# Patient Record
Sex: Female | Born: 1985 | Race: Black or African American | Hispanic: No | Marital: Single | Smoking: Never smoker
Health system: Southern US, Community
[De-identification: ages and names within clinical notes are randomized; demographics above are authoritative.]

## PROBLEM LIST (undated history)

## (undated) DIAGNOSIS — S92919A Unspecified fracture of unspecified toe(s), initial encounter for closed fracture: Secondary | ICD-10-CM

## (undated) DIAGNOSIS — N83209 Unspecified ovarian cyst, unspecified side: Secondary | ICD-10-CM

## (undated) DIAGNOSIS — A499 Bacterial infection, unspecified: Secondary | ICD-10-CM

## (undated) DIAGNOSIS — B019 Varicella without complication: Secondary | ICD-10-CM

## (undated) DIAGNOSIS — B379 Candidiasis, unspecified: Secondary | ICD-10-CM

## (undated) DIAGNOSIS — B999 Unspecified infectious disease: Secondary | ICD-10-CM

## (undated) DIAGNOSIS — IMO0002 Reserved for concepts with insufficient information to code with codable children: Secondary | ICD-10-CM

## (undated) HISTORY — DX: Bacterial infection, unspecified: A49.9

## (undated) HISTORY — DX: Candidiasis, unspecified: B37.9

## (undated) HISTORY — DX: Varicella without complication: B01.9

## (undated) HISTORY — DX: Unspecified infectious disease: B99.9

## (undated) HISTORY — PX: INDUCED ABORTION: SHX677

## (undated) HISTORY — DX: Reserved for concepts with insufficient information to code with codable children: IMO0002

## (undated) HISTORY — PX: DILATION AND CURETTAGE OF UTERUS: SHX78

## (undated) HISTORY — DX: Unspecified fracture of unspecified toe(s), initial encounter for closed fracture: S92.919A

## (undated) HISTORY — DX: Unspecified ovarian cyst, unspecified side: N83.209

---
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
## 2004-01-27 ENCOUNTER — Emergency Department (HOSPITAL_COMMUNITY): Admission: EM | Admit: 2004-01-27 | Discharge: 2004-01-27 | Payer: Self-pay | Admitting: Emergency Medicine

## 2004-02-22 ENCOUNTER — Emergency Department (HOSPITAL_COMMUNITY): Admission: EM | Admit: 2004-02-22 | Discharge: 2004-02-23 | Payer: Self-pay | Admitting: Emergency Medicine

## 2004-10-05 ENCOUNTER — Ambulatory Visit: Payer: Self-pay | Admitting: Internal Medicine

## 2005-04-11 ENCOUNTER — Ambulatory Visit: Payer: Self-pay | Admitting: Family Medicine

## 2005-07-11 ENCOUNTER — Ambulatory Visit: Payer: Self-pay | Admitting: Family Medicine

## 2006-11-25 DIAGNOSIS — R87619 Unspecified abnormal cytological findings in specimens from cervix uteri: Secondary | ICD-10-CM

## 2006-11-25 DIAGNOSIS — IMO0002 Reserved for concepts with insufficient information to code with codable children: Secondary | ICD-10-CM

## 2006-11-25 HISTORY — DX: Unspecified abnormal cytological findings in specimens from cervix uteri: R87.619

## 2006-11-25 HISTORY — DX: Reserved for concepts with insufficient information to code with codable children: IMO0002

## 2009-06-20 ENCOUNTER — Inpatient Hospital Stay (HOSPITAL_COMMUNITY): Admission: AD | Admit: 2009-06-20 | Discharge: 2009-06-20 | Payer: Self-pay | Admitting: Obstetrics & Gynecology

## 2009-08-01 ENCOUNTER — Emergency Department (HOSPITAL_COMMUNITY): Admission: EM | Admit: 2009-08-01 | Discharge: 2009-08-01 | Payer: Self-pay | Admitting: Emergency Medicine

## 2010-08-09 ENCOUNTER — Emergency Department (HOSPITAL_COMMUNITY): Admission: EM | Admit: 2010-08-09 | Discharge: 2010-08-09 | Payer: Self-pay | Admitting: Family Medicine

## 2010-11-25 DIAGNOSIS — S92919A Unspecified fracture of unspecified toe(s), initial encounter for closed fracture: Secondary | ICD-10-CM

## 2010-11-25 HISTORY — DX: Unspecified fracture of unspecified toe(s), initial encounter for closed fracture: S92.919A

## 2011-02-05 DIAGNOSIS — B379 Candidiasis, unspecified: Secondary | ICD-10-CM

## 2011-02-05 HISTORY — DX: Candidiasis, unspecified: B37.9

## 2011-02-23 DIAGNOSIS — A499 Bacterial infection, unspecified: Secondary | ICD-10-CM

## 2011-02-23 DIAGNOSIS — B019 Varicella without complication: Secondary | ICD-10-CM

## 2011-02-23 HISTORY — DX: Varicella without complication: B01.9

## 2011-02-23 HISTORY — DX: Bacterial infection, unspecified: A49.9

## 2011-02-23 HISTORY — PX: WISDOM TOOTH EXTRACTION: SHX21

## 2011-03-03 LAB — URINALYSIS, ROUTINE W REFLEX MICROSCOPIC
Glucose, UA: NEGATIVE mg/dL
Hgb urine dipstick: NEGATIVE
Specific Gravity, Urine: 1.01 (ref 1.005–1.030)
Urobilinogen, UA: 0.2 mg/dL (ref 0.0–1.0)
pH: 5.5 (ref 5.0–8.0)

## 2011-03-03 LAB — WET PREP, GENITAL
Clue Cells Wet Prep HPF POC: NONE SEEN
Trich, Wet Prep: NONE SEEN
Yeast Wet Prep HPF POC: NONE SEEN

## 2011-03-03 LAB — GC/CHLAMYDIA PROBE AMP, GENITAL: GC Probe Amp, Genital: NEGATIVE

## 2012-03-04 ENCOUNTER — Ambulatory Visit: Payer: Self-pay | Admitting: Obstetrics and Gynecology

## 2012-03-25 ENCOUNTER — Other Ambulatory Visit (INDEPENDENT_AMBULATORY_CARE_PROVIDER_SITE_OTHER): Payer: 59

## 2012-03-25 DIAGNOSIS — N912 Amenorrhea, unspecified: Secondary | ICD-10-CM

## 2012-03-25 NOTE — Progress Notes (Unsigned)
LMP 02-24-2012, PNV samples given.

## 2012-04-07 ENCOUNTER — Ambulatory Visit: Payer: Self-pay | Admitting: Obstetrics and Gynecology

## 2012-04-08 ENCOUNTER — Ambulatory Visit: Payer: Self-pay | Admitting: Obstetrics and Gynecology

## 2012-04-13 ENCOUNTER — Encounter (HOSPITAL_COMMUNITY): Payer: Self-pay | Admitting: *Deleted

## 2012-04-13 ENCOUNTER — Inpatient Hospital Stay (HOSPITAL_COMMUNITY)
Admission: AD | Admit: 2012-04-13 | Discharge: 2012-04-13 | Disposition: A | Discharge status: Home / Self Care | Payer: 59 | Source: Ambulatory Visit | Attending: Obstetrics and Gynecology | Admitting: Obstetrics and Gynecology

## 2012-04-13 ENCOUNTER — Inpatient Hospital Stay (HOSPITAL_COMMUNITY): Payer: 59

## 2012-04-13 ENCOUNTER — Other Ambulatory Visit: Payer: Self-pay | Admitting: Obstetrics and Gynecology

## 2012-04-13 ENCOUNTER — Telehealth: Payer: Self-pay | Admitting: Obstetrics and Gynecology

## 2012-04-13 DIAGNOSIS — O210 Mild hyperemesis gravidarum: Secondary | ICD-10-CM | POA: Insufficient documentation

## 2012-04-13 DIAGNOSIS — R109 Unspecified abdominal pain: Secondary | ICD-10-CM | POA: Insufficient documentation

## 2012-04-13 DIAGNOSIS — O219 Vomiting of pregnancy, unspecified: Secondary | ICD-10-CM

## 2012-04-13 DIAGNOSIS — M549 Dorsalgia, unspecified: Secondary | ICD-10-CM | POA: Insufficient documentation

## 2012-04-13 DIAGNOSIS — O218 Other vomiting complicating pregnancy: Secondary | ICD-10-CM

## 2012-04-13 LAB — CBC
HCT: 38.1 % (ref 36.0–46.0)
MCHC: 33.6 g/dL (ref 30.0–36.0)
RDW: 12.1 % (ref 11.5–15.5)

## 2012-04-13 LAB — URINALYSIS, ROUTINE W REFLEX MICROSCOPIC
Ketones, ur: NEGATIVE mg/dL
Leukocytes, UA: NEGATIVE
Nitrite: NEGATIVE
Protein, ur: NEGATIVE mg/dL
Urobilinogen, UA: 2 mg/dL — ABNORMAL HIGH (ref 0.0–1.0)

## 2012-04-13 LAB — WET PREP, GENITAL
Trich, Wet Prep: NONE SEEN
Yeast Wet Prep HPF POC: NONE SEEN

## 2012-04-13 MED ORDER — ONDANSETRON 8 MG PO TBDP: 8.0000 mg | ORAL_TABLET | Freq: Three times a day (TID) | ORAL | Status: AC | PRN

## 2012-04-13 MED ORDER — METRONIDAZOLE 0.75 % VA GEL
1.0000 | Freq: Two times a day (BID) | VAGINAL | Status: DC
  Filled 2012-04-13: qty 70

## 2012-04-13 MED ORDER — LACTATED RINGERS IV BOLUS (SEPSIS)
500.0000 mL | Freq: Once | INTRAVENOUS | Status: AC
  Administered 2012-04-13: 1000 mL via INTRAVENOUS

## 2012-04-13 MED ORDER — ONDANSETRON HCL 4 MG/2ML IJ SOLN
4.0000 mg | Freq: Once | INTRAMUSCULAR | Status: AC
  Administered 2012-04-13: 4 mg via INTRAVENOUS
  Filled 2012-04-13: qty 2

## 2012-04-13 MED ORDER — LACTATED RINGERS IV SOLN: INTRAVENOUS | Status: DC

## 2012-04-13 NOTE — Telephone Encounter (Signed)
Spoke with pt rgd msg. Pt stated that she been sick cant keep food down for 1 week , some lower pelvic pain . Told pt will consult with the midwife on call.

## 2012-04-13 NOTE — MAU Provider Note (Signed)
  History     CSN: [id number]  Arrival date and time: 04/13/12 1108   None     Chief Complaint  Patient presents with  . Emesis  . Abdominal Pain  . Back Pain   HPI Comments: Pt is a G4P0 at [redacted]w[redacted]d by LMP of 4/1. Arrives after talking to triage RN at office c/o persistent N/V and r sided abd pain x1 week. States everything she eats or drinks is coming up. Has not been able to have a BM for a few days. Pain is constant, gets better at times. Pt states she's not tried anything for pain. She denies any bleeding, d/c or cramping/ctx.      Past Medical History  Diagnosis Date  . Chicken pox 02/23/2011  . Bacterial infection 02/23/2011  . Yeast infection 02/05/2011  . No pertinent past medical history     Past Surgical History  Procedure Date  . Wisdom tooth extraction 02/23/2011  . Ovarian cyst surgery 02/23/2011  . Dilation and curettage of uterus   . Induced abortion     Family History  Problem Relation Age of Onset  . Hypertension Mother   . Asthma Mother   . Cancer Paternal Grandmother   . Anesthesia problems Neg Hx   . Hypotension Neg Hx   . Malignant hyperthermia Neg Hx   . Pseudochol deficiency Neg Hx     History  Substance Use Topics  . Smoking status: Never Smoker   . Smokeless tobacco: Not on file  . Alcohol Use: Yes     not with pregnancy     Allergies: No Known Allergies  No prescriptions prior to admission    Review of Systems  Gastrointestinal: Positive for nausea, vomiting, abdominal pain and constipation.  All other systems reviewed and are negative.   Physical Exam   Blood pressure 128/72, pulse 76, temperature 97.4 F (36.3 C), temperature source Oral, resp. rate 16, height 5\' 3"  (1.6 m), weight 153 lb 12.8 oz (69.763 kg), last menstrual period 02/24/2012, SpO2 100.00%.  Physical Exam  Nursing note and vitals reviewed. Constitutional: She is oriented to person, place, and time. She appears well-developed and well-nourished. No distress.    HENT:  Head: Normocephalic and atraumatic.  Neck: Normal range of motion. Neck supple.  Cardiovascular: Normal rate, regular rhythm and normal heart sounds.   Respiratory: Effort normal and breath sounds normal.  GI: Soft. Bowel sounds are normal.  Genitourinary: Vagina normal and uterus normal. No vaginal discharge found.  Musculoskeletal: Normal range of motion. She exhibits no edema.  Neurological: She is alert and oriented to person, place, and time. She has normal reflexes.  Skin: Skin is warm and dry.  Psychiatric: She has a normal mood and affect. Her behavior is normal.    [place] = [facility] at 7w c/w LMP date  EDD = 12/01/11  MAU Course  Procedures  CBC IVF's zofran IV Wet prep GC/CT   Assessment and Plan  N/V in early pregnancy [facility] at 7w Labs nl Pt feels better after IVF's and zofran D/C home w Rx for zofran and phenergan, and metrogel for BV, comfort measures for N/V, diet recommendations  Pt to keep NOB w/u appt this week.   Kayleann Mccaffery M 04/13/2012, 1:26 PM

## 2012-04-13 NOTE — Discharge Instructions (Signed)
Hyperemesis Gravidarum Diet Hyperemesis gravidarum is a severe form of morning sickness. It is characterized by frequent and severe vomiting. It happens during the first trimester of pregnancy. It may be caused by the rapid hormone changes that happen during pregnancy. It is associated with a 5% weight loss of pre-pregnancy weight. The hyperemesis diet may be used to lessen symptoms of nausea and vomiting. EATING GUIDELINES  Eat 5 to 6 small meals daily instead of 3 large meals.   Avoid foods with strong smells.   Avoid drinking 30 minutes before and after meals.   Avoid fried or high-fat foods, such as butter and cream sauces.   Starchy foods are usually well-tolerated, such as cereal, toast, bread, potatoes, pasta, rice, and pretzels.   Eat crackers before you get out of bed in the morning.   Avoid spicy foods.   Ginger may help with nausea. Add  tsp ginger to hot tea or choose ginger tea.   Continue to take your prenatal vitamins as directed by your caregiver.  SAMPLE MEAL PLAN Breakfast    cup oatmeal   1 slice toast   1 tsp heart-healthy margarine   1 tsp jelly   1 scrambled egg  Midmorning Snack   1 cup low-fat yogurt  Lunch   Plain ham sandwich   Carrot or celery sticks   1 small apple   3 graham crackers  Midafternoon Snack   Cheese and crackers  Dinner  4 oz pork tenderloin   1 small baked potato   1 tsp margarine    cup broccoli    cup grapes  Evening Snack  1 cup pudding  Document Released: 09/08/2007 Document Revised: 10/31/2011 Document Reviewed: 03/08/2011 [facility] Patient Information 2012 [place], [place].ABCs of Pregnancy A Antepartum care is very important. Be sure you see your doctor and get prenatal care as soon as you think you are pregnant. At this time, you will be tested for infection, genetic abnormalities and potential problems with you and the pregnancy. This is the time to discuss diet, exercise, work, medications, labor,  pain medication during labor and the possibility of a cesarean delivery. Ask any questions that may concern you. It is important to see your doctor regularly throughout your pregnancy. Avoid exposure to toxic substances and chemicals - such as cleaning solvents, lead and mercury, some insecticides, and paint. Pregnant women should avoid exposure to paint fumes, and fumes that cause you to feel ill, dizzy or faint. When possible, it is a good idea to have a pre-pregnancy consultation with your caregiver to begin some important recommendations your caregiver suggests such as, taking folic acid, exercising, quitting smoking, avoiding alcoholic beverages, etc. B Breastfeeding is the healthiest choice for both you and your baby. It has many nutritional benefits for the baby and health benefits for the mother. It also creates a very tight and loving bond between the baby and mother. Talk to your doctor, your family and friends, and your employer about how you choose to feed your baby and how they can support you in your decision. Not all birth defects can be prevented, but a woman can take actions that may increase her chance of having a healthy baby. Many birth defects happen very early in pregnancy, sometimes before a woman even knows she is pregnant. Birth defects or abnormalities of any child in your or the father's family should be discussed with your caregiver. Get a good support bra as your breast size changes. Wear it especially when you exercise  and when nursing.  C Celebrate the news of your pregnancy with the your spouse/father and family. Childbirth classes are helpful to take for you and the spouse/father because it helps to understand what happens during the pregnancy, labor and delivery. Cesarean delivery should be discussed with your doctor so you are prepared for that possibility. The pros and cons of circumcision if it is a boy, should be discussed with your pediatrician. Cigarette smoking during  pregnancy can result in low birth weight babies. It has been associated with infertility, miscarriages, tubal pregnancies, infant death (mortality) and poor health (morbidity) in childhood. Additionally, cigarette smoking may cause long-term learning disabilities. If you smoke, you should try to quit before getting pregnant and not smoke during the pregnancy. Secondary smoke may also harm a mother and her developing baby. It is a good idea to ask people to stop smoking around you during your pregnancy and after the baby is born. Extra calcium is necessary when you are pregnant and is found in your prenatal vitamin, in dairy products, green leafy vegetables and in calcium supplements. D A healthy diet according to your current weight and height, along with vitamins and mineral supplements should be discussed with your caregiver. Domestic abuse or violence should be made known to your doctor right away to get the situation corrected. Drink more water when you exercise to keep hydrated. Discomfort of your back and legs usually develops and progresses from the middle of the second trimester through to delivery of the baby. This is because of the enlarging baby and uterus, which may also affect your balance. Do not take illegal drugs. Illegal drugs can seriously harm the baby and you. Drink extra fluids (water is best) throughout pregnancy to help your body keep up with the increases in your blood volume. Drink at least 6 to 8 glasses of water, fruit juice, or milk each day. A good way to know you are drinking enough fluid is when your urine looks almost like clear water or is very light yellow.  E Eat healthy to get the nutrients you and your unborn baby need. Your meals should include the five basic food groups. Exercise (30 minutes of light to moderate exercise a day) is important and encouraged during pregnancy, if there are no medical problems or problems with the pregnancy. Exercise that causes discomfort or  dizziness should be stopped and reported to your caregiver. Emotions during pregnancy can change from being ecstatic to depression and should be understood by you, your partner and your family. F Fetal screening with ultrasound, amniocentesis and monitoring during pregnancy and labor is common and sometimes necessary. Take 400 micrograms of folic acid daily both before, when possible, and during the first few months of pregnancy to reduce the risk of birth defects of the brain and spine. All women who could possibly become pregnant should take a vitamin with folic acid, every day. It is also important to eat a healthy diet with fortified foods (enriched grain products, including cereals, rice, breads, and pastas) and foods with natural sources of folate (orange juice, green leafy vegetables, beans, peanuts, broccoli, asparagus, peas, and lentils). The father should be involved with all aspects of the pregnancy including, the prenatal care, childbirth classes, labor, delivery, and postpartum time. Fathers may also have emotional concerns about being a father, financial needs, and raising a family. G Genetic testing should be done appropriately. It is important to know your family and the father's history. If there have been problems with  pregnancies or birth defects in your family, report these to your doctor. Also, genetic counselors can talk with you about the information you might need in making decisions about having a family. You can call a major medical center in your area for help in finding a board-certified genetic counselor. Genetic testing and counseling should be done before pregnancy when possible, especially if there is a history of problems in the mother's or father's family. Certain ethnic backgrounds are more at risk for genetic defects. H Get familiar with the hospital where you will be having your baby. Get to know how long it takes to get there, the labor and delivery area, and the hospital  procedures. Be sure your medical insurance is accepted there. Get your home ready for the baby including, clothes, the baby's room (when possible), furniture and car seat. Hand washing is important throughout the day, especially after handling raw meat and poultry, changing the baby's diaper or using the bathroom. This can help prevent the spread of many bacteria and viruses that cause infection. Your hair may become dry and thinner, but will return to normal a few weeks after the baby is born. Heartburn is a common problem that can be treated by taking antacids recommended by your caregiver, eating smaller meals 5 or 6 times a day, not drinking liquids when eating, drinking between meals and raising the head of your bed 2 to 3 inches. I Insurance to cover you, the baby, doctor and hospital should be reviewed so that you will be prepared to pay any costs not covered by your insurance plan. If you do not have medical insurance, there are usually clinics and services available for you in your community. Take 30 milligrams of iron during your pregnancy as prescribed by your doctor to reduce the risk of low red blood cells (anemia) later in pregnancy. All women of childbearing age should eat a diet rich in iron. J There should be a joint effort for the mother, father and any other children to adapt to the pregnancy financially, emotionally, and psychologically during the pregnancy. Join a support group for moms-to-be. Or, join a class on parenting or childbirth. Have the family participate when possible. K Know your limits. Let your caregiver know if you experience any of the following:   Pain of any kind.   Strong cramps.   You develop a lot of weight in a short period of time (5 pounds in 3 to 5 days).   Vaginal bleeding, leaking of amniotic fluid.   Headache, vision problems.   Dizziness, fainting, shortness of breath.   Chest pain.   Fever of 102 F (38.9 C) or higher.   Gush of clear fluid  from your vagina.   Painful urination.   Domestic violence.   Irregular heartbeat (palpitations).   Rapid beating of the heart (tachycardia).   Constant feeling sick to your stomach (nauseous) and vomiting.   Trouble walking, fluid retention (edema).   Muscle weakness.   If your baby has decreased activity.   Persistent diarrhea.   Abnormal vaginal discharge.   Uterine contractions at 20-minute intervals.   Back pain that travels down your leg.  L Learn and practice that what you eat and drink should be in moderation and healthy for you and your baby. Legal drugs such as alcohol and caffeine are important issues for pregnant women. There is no safe amount of alcohol a woman can drink while pregnant. Fetal alcohol syndrome, a disorder characterized by growth retardation,  facial abnormalities, and central nervous system dysfunction, is caused by a woman's use of alcohol during pregnancy. Caffeine, found in tea, coffee, soft drinks and chocolate, should also be limited. Be sure to read labels when trying to cut down on caffeine during pregnancy. More than 200 foods, beverages, and over-the-counter medications contain caffeine and have a high salt content! There are coffees and teas that do not contain caffeine. M Medical conditions such as diabetes, epilepsy, and high blood pressure should be treated and kept under control before pregnancy when possible, but especially during pregnancy. Ask your caregiver about any medications that may need to be changed or adjusted during pregnancy. If you are currently taking any medications, ask your caregiver if it is safe to take them while you are pregnant or before getting pregnant when possible. Also, be sure to discuss any herbs or vitamins you are taking. They are medicines, too! Discuss with your doctor all medications, prescribed and over-the-counter, that you are taking. During your prenatal visit, discuss the medications your doctor may give  you during labor and delivery. N Never be afraid to ask your doctor or caregiver questions about your health, the progress of the pregnancy, family problems, stressful situations, and recommendation for a pediatrician, if you do not have one. It is better to take all precautions and discuss any questions or concerns you may have during your office visits. It is a good idea to write down your questions before you visit the doctor. O Over-the-counter cough and cold remedies may contain alcohol or other ingredients that should be avoided during pregnancy. Ask your caregiver about prescription, herbs or over-the-counter medications that you are taking or may consider taking while pregnant.  P Physical activity during pregnancy can benefit both you and your baby by lessening discomfort and fatigue, providing a sense of well-being, and increasing the likelihood of early recovery after delivery. Light to moderate exercise during pregnancy strengthens the belly (abdominal) and back muscles. This helps improve posture. Practicing yoga, walking, swimming, and cycling on a stationary bicycle are usually safe exercises for pregnant women. Avoid scuba diving, exercise at high altitudes (over 3000 feet), skiing, horseback riding, contact sports, etc. Always check with your doctor before beginning any kind of exercise, especially during pregnancy and especially if you did not exercise before getting pregnant. Q Queasiness, stomach upset and morning sickness are common during pregnancy. Eating a couple of crackers or dry toast before getting out of bed. Foods that you normally love may make you feel sick to your stomach. You may need to substitute other nutritious foods. Eating 5 or 6 small meals a day instead of 3 large ones may make you feel better. Do not drink with your meals, drink between meals. Questions that you have should be written down and asked during your prenatal visits. R Read about and make plans to  baby-proof your home. There are important tips for making your home a safer environment for your baby. Review the tips and make your home safer for you and your baby. Read food labels regarding calories, salt and fat content in the food. S Saunas, hot tubs, and steam rooms should be avoided while you are pregnant. Excessive high heat may be harmful during your pregnancy. Your caregiver will screen and examine you for sexually transmitted diseases and genetic disorders during your prenatal visits. Learn the signs of labor. Sexual relations while pregnant is safe unless there is a medical or pregnancy problem and your caregiver advises against it. T  Traveling long distances should be avoided especially in the third trimester of your pregnancy. If you do have to travel out of state, be sure to take a copy of your medical records and medical insurance plan with you. You should not travel long distances without seeing your doctor first. Most airlines will not allow you to travel after 36 weeks of pregnancy. Toxoplasmosis is an infection caused by a parasite that can seriously harm an unborn baby. Avoid eating undercooked meat and handling cat litter. Be sure to wear gloves when gardening. Tingling of the hands and fingers is not unusual and is due to fluid retention. This will go away after the baby is born. U Womb (uterus) size increases during the first trimester. Your kidneys will begin to function more efficiently. This may cause you to feel the need to urinate more often. You may also leak urine when sneezing, coughing or laughing. This is due to the growing uterus pressing against your bladder, which lies directly in front of and slightly under the uterus during the first few months of pregnancy. If you experience burning along with frequency of urination or bloody urine, be sure to tell your doctor. The size of your uterus in the third trimester may cause a problem with your balance. It is advisable to  maintain good posture and avoid wearing high heels during this time. An ultrasound of your baby may be necessary during your pregnancy and is safe for you and your baby. V Vaccinations are an important concern for pregnant women. Get needed vaccines before pregnancy. Center for Disease Control ([employer]Ex[employer].com.br) has clear guidelines for the use of vaccines during pregnancy. Review the list, be sure to discuss it with your doctor. Prenatal vitamins are helpful and healthy for you and the baby. Do not take extra vitamins except what is recommended. Taking too much of certain vitamins can cause overdose problems. Continuous vomiting should be reported to your caregiver. Varicose veins may appear especially if there is a family history of varicose veins. They should subside after the delivery of the baby. Support hose helps if there is leg discomfort. W Being overweight or underweight during pregnancy may cause problems. Try to get within 15 pounds of your ideal weight before pregnancy. Remember, pregnancy is not a time to be dieting! Do not stop eating or start skipping meals as your weight increases. Both you and your baby need the calories and nutrition you receive from a healthy diet. Be sure to consult with your doctor about your diet. There is a formula and diet plan available depending on whether you are overweight or underweight. Your caregiver or nutritionist can help and advise you if necessary. X Avoid X-rays. If you must have dental work or diagnostic tests, tell your dentist or physician that you are pregnant so that extra care can be taken. X-rays should only be taken when the risks of not taking them outweigh the risk of taking them. If needed, only the minimum amount of radiation should be used. When X-rays are necessary, protective lead shields should be used to cover areas of the body that are not being X-rayed. Y Your baby loves you. Breastfeeding your baby creates a loving and very close bond  between the two of you. Give your baby a healthy environment to live in while you are pregnant. Infants and children require constant care and guidance. Their health and safety should be carefully watched at all times. After the baby is born, rest or take a nap  when the baby is sleeping. Z Get your ZZZs. Be sure to get plenty of rest. Resting on your side as often as possible, especially on your left side is advised. It provides the best circulation to your baby and helps reduce swelling. Try taking a nap for 30 to 45 minutes in the afternoon when possible. After the baby is born rest or take a nap when the baby is sleeping. Try elevating your feet for that amount of time when possible. It helps the circulation in your legs and helps reduce swelling.  Most information courtesy of the CDC. Document Released: 11/11/2005 Document Revised: 10/31/2011 Document Reviewed: 07/26/2009 [employer] Patient Information 2012 Exit[employer], LLCBacterial Vaginosis Bacterial vaginosis (BV) is a vaginal infection where the normal balance of bacteria in the vagina is disrupted. The normal balance is then replaced by an overgrowth of certain bacteria. There are several different kinds of bacteria that can cause BV. BV is the most common vaginal infection in women of childbearing age. CAUSES   The cause of BV is not fully understood. BV develops when there is an increase or imbalance of harmful bacteria.   Some activities or behaviors can upset the normal balance of bacteria in the vagina and put women at increased risk including:   Having a new sex partner or multiple sex partners.   Douching.   Using an intrauterine device (IUD) for contraception.   It is not clear what role sexual activity plays in the development of BV. However, women that have never had sexual intercourse are rarely infected with BV.  Women do not get BV from toilet seats, bedding, swimming pools or from touching objects around them.  SYMPTOMS    Grey vaginal discharge.   A fish-like odor with discharge, especially after sexual intercourse.   Itching or burning of the vagina and vulva.   Burning or pain with urination.   Some women have no signs or symptoms at all.  DIAGNOSIS  Your caregiver must examine the vagina for signs of BV. Your caregiver will perform lab tests and look at the sample of vaginal fluid through a microscope. They will look for bacteria and abnormal cells (clue cells), a pH test higher than 4.5, and a positive amine test all associated with BV.  RISKS AND COMPLICATIONS   Pelvic inflammatory disease (PID).   Infections following gynecology surgery.   Developing HIV.   Developing herpes virus.  TREATMENT  Sometimes BV will clear up without treatment. However, all women with symptoms of BV should be treated to avoid complications, especially if gynecology surgery is planned. Female partners generally do not need to be treated. However, BV may spread between female sex partners so treatment is helpful in preventing a recurrence of BV.   BV may be treated with antibiotics. The antibiotics come in either pill or vaginal cream forms. Either can be used with nonpregnant or pregnant women, but the recommended dosages differ. These antibiotics are not harmful to the baby.   BV can recur after treatment. If this happens, a second round of antibiotics will often be prescribed.   Treatment is important for pregnant women. If not treated, BV can cause a premature delivery, especially for a pregnant woman who had a premature birth in the past. All pregnant women who have symptoms of BV should be checked and treated.   For chronic reoccurrence of BV, treatment with a type of prescribed gel vaginally twice a week is helpful.  HOME CARE INSTRUCTIONS   Finish all medication  as directed by your caregiver.   Do not have sex until treatment is completed.   Tell your sexual partner that you have a vaginal infection. They  should see their caregiver and be treated if they have problems, such as a mild rash or itching.   Practice safe sex. Use condoms. Only have 1 sex partner.  PREVENTION  Basic prevention steps can help reduce the risk of upsetting the natural balance of bacteria in the vagina and developing BV:  Do not have sexual intercourse (be abstinent).   Do not douche.   Use all of the medicine prescribed for treatment of BV, even if the signs and symptoms go away.   Tell your sex partner if you have BV. That way, they can be treated, if needed, to prevent reoccurrence.  SEEK MEDICAL CARE IF:   Your symptoms are not improving after 3 days of treatment.   You have increased discharge, pain, or fever.  MAKE SURE YOU:   Understand these instructions.   Will watch your condition.   Will get help right away if you are not doing well or get worse.  FOR MORE INFORMATION  Division of STD Prevention (DSTDP), Centers for Disease Control and Prevention: [employer]Apps.[employer].za American Social Health Association (ASHA): www.ashastd.org  Document Released: 11/11/2005 Document Revised: 10/31/2011 Document Reviewed: 05/04/2009 [facility] Patient Information 2012 [place], [place].Marland Kitchen

## 2012-04-13 NOTE — Telephone Encounter (Signed)
Erica Garrett/cht

## 2012-04-13 NOTE — Telephone Encounter (Signed)
Spoke with Erica Garrett per pt's phone call. Erica Garrett advised me to tell the pt to come to [facility] to be seen.pt's voice understanding.

## 2012-04-13 NOTE — MAU Note (Signed)
Patient states she has been nauseated and vomiting for about one week. States she has mid abdominal and low back pain off and on. Denies any bleeding or discharge.

## 2012-04-16 ENCOUNTER — Ambulatory Visit (INDEPENDENT_AMBULATORY_CARE_PROVIDER_SITE_OTHER): Payer: 59 | Admitting: Obstetrics and Gynecology

## 2012-04-16 DIAGNOSIS — Z331 Pregnant state, incidental: Secondary | ICD-10-CM

## 2012-04-16 DIAGNOSIS — Z3201 Encounter for pregnancy test, result positive: Secondary | ICD-10-CM

## 2012-04-16 LAB — POCT URINALYSIS DIPSTICK
Bilirubin, UA: NEGATIVE
Glucose, UA: NEGATIVE
Ketones, UA: NEGATIVE
Nitrite, UA: NEGATIVE

## 2012-04-16 NOTE — Progress Notes (Signed)
Pt states she is unsure if she will continue with this pregnancy. NOB w/u appt sched 05/08/12. Will await her decision.

## 2012-04-17 ENCOUNTER — Encounter: Payer: 59 | Admitting: Obstetrics and Gynecology

## 2012-04-17 LAB — PRENATAL PANEL VII
Eosinophils Absolute: 0.1 10*3/uL (ref 0.0–0.7)
Hepatitis B Surface Ag: NEGATIVE
Lymphocytes Relative: 20 % (ref 12–46)
Lymphs Abs: 1.9 10*3/uL (ref 0.7–4.0)
MCHC: 34.3 g/dL (ref 30.0–36.0)
MCV: 84.8 fL (ref 78.0–100.0)
Monocytes Absolute: 0.3 10*3/uL (ref 0.1–1.0)
Monocytes Relative: 3 % (ref 3–12)
Platelets: 208 10*3/uL (ref 150–400)
RBC: 4.2 MIL/uL (ref 3.87–5.11)
RDW: 12.9 % (ref 11.5–15.5)

## 2012-04-17 LAB — CULTURE, OB URINE: Colony Count: 6000

## 2012-04-21 LAB — HEMOGLOBINOPATHY EVALUATION
Hemoglobin Other: 0 %
Hgb A2 Quant: 2.9 % (ref 2.2–3.2)
Hgb A: 97.1 % (ref 96.8–97.8)
Hgb F Quant: 0 % (ref 0.0–2.0)
Hgb S Quant: 0 %

## 2012-05-08 ENCOUNTER — Encounter: Payer: 59 | Admitting: Obstetrics and Gynecology

## 2013-01-19 ENCOUNTER — Encounter: Payer: Self-pay | Admitting: Obstetrics and Gynecology

## 2013-01-19 ENCOUNTER — Ambulatory Visit: Payer: 59 | Admitting: Obstetrics and Gynecology

## 2013-01-19 VITALS — BP 100/70 | HR 70 | Temp 97.8°F | Ht 62.0 in | Wt 149.5 lb

## 2013-01-19 DIAGNOSIS — R300 Dysuria: Secondary | ICD-10-CM

## 2013-01-19 DIAGNOSIS — Z124 Encounter for screening for malignant neoplasm of cervix: Secondary | ICD-10-CM

## 2013-01-19 DIAGNOSIS — R110 Nausea: Secondary | ICD-10-CM

## 2013-01-19 DIAGNOSIS — R102 Pelvic and perineal pain: Secondary | ICD-10-CM

## 2013-01-19 DIAGNOSIS — Z01419 Encounter for gynecological examination (general) (routine) without abnormal findings: Secondary | ICD-10-CM

## 2013-01-19 LAB — POCT URINALYSIS DIPSTICK
Leukocytes, UA: NEGATIVE
pH, UA: 6

## 2013-01-19 NOTE — Progress Notes (Signed)
Regular Periods: yes Mammogram: no u/s   nl  Monthly Breast Ex.: yes Exercise: no  Tetanus < 10 years: yes Seatbelts: yes  NI. Bladder Functn.: yes Abuse at home: no  Daily BM's: yes Stressful Work: no  Healthy Diet: no Sigmoid-Colonoscopy: no  Calcium: no Medical problems this year: abdominal pain and nausea   LAST PAP:4/12  Contraception: none  Mammogram:  Had u/s done  nl  PCP: no  PMH: no change  FMH: no change  Last Bone Scan: no  Pt is single;engaged

## 2013-01-19 NOTE — Patient Instructions (Addendum)
Clear Liquid Diet The clear liquid dietconsists of foods that are liquid or will become liquid at room temperature.You should be able to see through the liquid and beverages. Examples of foods allowed on a clear liquid diet include fruit juice, broth or bouillon, gelatin, or frozen ice pops. The purpose of this diet is to provide necessary fluid, electrolytes such as sodium and potassium, and energy to keep the body functioning during times when you are not able to consume a regular diet.A clear liquid diet should not be continued for long periods of time as it is not nutritionally adequate.  Breads and Starches  Allowed:  None are allowed.  Avoid: All are avoided. Vegetables  Allowed:  Strained  vegetable juice.  Avoid: Tomatoes Fruit  Allowed:  Strained fruit juices and fruit drinks.   Avoid: Citrus Juices  Meat and Meat Substitutes  Allowed:  None are allowed.  Avoid: All are avoided. Milk  Allowed:  None are allowed.  Avoid: All are avoided. Soups and Combination Foods  Allowed:  Clear bouillon, broth, or strained broth-based soups.  Avoid: Any others. Desserts and Sweets  Allowed:  Sugar, honey. High protein gelatin. Flavored gelatin, ices, or frozen ice pops that do not contain milk.  Avoid: Any others. Fats and Oils  Allowed:  None are allowed.  Avoid: All are avoided. Beverages  Allowed: Cereal beverages, coffee ( decaffeinated), tea, or soda   Avoid: Any others. Condiments  Allowed:  Iodized salt.  Avoid: Any others, including pepper.   Multivitamins with 400 mcg or more of folic acid

## 2013-01-19 NOTE — Progress Notes (Signed)
Subjective:    Erica Garrett is a 27 y.o. female, G4P0030, who presents for an annual exam. The patient reports abdominal pain that is achy and occasionally sharp since yesterday accompanied by nausea (peri-umbilical area).  No increasing or decreasing factors nor changes in bowel movements/urinary function.  Has not had a fever. Pain now is rated as a 6/10 on a 10 point pain scale.  Menstrual cycle:   LMP: Patient's last menstrual period was 02/24/2012. Flow 5 days with pad change 4 times a day, minimal cramping             Review of Systems Pertinent items are noted in HPI. Denies pelvic pain, urinary tract symptoms, vaginitis symptoms, irregular bleeding, menopausal symptoms, change in bowel habits or rectal bleeding   Objective:    BP 100/70  Pulse 70  Ht 5\' 2"  (1.575 m)  Wt 149 lb 8 oz (67.813 kg)  BMI 27.34 kg/m2  LMP 02/24/2012  Breastfeeding? Unknown    Wt Readings from Last 1 Encounters:  01/19/13 149 lb 8 oz (67.813 kg)  Temperature 97.8 degrees F orally Body mass index is 27.34 kg/(m^2). General Appearance: Alert, no acute distress HEENT: Grossly normal Neck / Thyroid: Supple, no thyromegaly or cervical adenopathy Lungs: Clear to auscultation bilaterally Back: No CVA tenderness Breast Exam: No masses or nodes.No dimpling, nipple retraction or discharge. Cardiovascular: Regular rate and rhythm.  Gastrointestinal: Soft, non-tender, no masses or organomegaly Pelvic Exam: EGBUS-wnl, vagina-normal rugae, cervix- without lesions or tenderness, uterus appears normal size shape and consistency, adnexae-no masses or tenderness Lymphatic Exam: Non-palpable nodes in neck, clavicular,  axillary, or inguinal regions  Skin: no rashes or abnormalities Extremities: no clubbing cyanosis or edema  Neurologic: grossly normal Psychiatric: Alert and oriented  UPT-negative U/A-negative   Assessment:   Routine GYN Exam Abdominal Pain with Nausea   Plan:   Liquid Diet with follow  up with Urgent Care for abdominal symptoms  Declines contraception, advised MVI with 400 mcg of folic acid or more daily  PAP sent  GC/CT-pending  RTO 1 year or prn  Yvett Rossel,ELMIRAPA-C

## 2013-01-20 LAB — PAP IG, CT-NG, RFX HPV ASCU: GC Probe Amp: NEGATIVE

## 2014-06-05 ENCOUNTER — Emergency Department (HOSPITAL_COMMUNITY)
Admission: EM | Admit: 2014-06-05 | Discharge: 2014-06-05 | Disposition: A | Discharge status: Home / Self Care | Payer: 59 | Attending: Emergency Medicine | Admitting: Emergency Medicine

## 2014-06-05 ENCOUNTER — Encounter (HOSPITAL_COMMUNITY): Payer: Self-pay | Admitting: Emergency Medicine

## 2014-06-05 DIAGNOSIS — Z87440 Personal history of urinary (tract) infections: Secondary | ICD-10-CM | POA: Insufficient documentation

## 2014-06-05 DIAGNOSIS — Z8619 Personal history of other infectious and parasitic diseases: Secondary | ICD-10-CM | POA: Insufficient documentation

## 2014-06-05 DIAGNOSIS — R599 Enlarged lymph nodes, unspecified: Secondary | ICD-10-CM | POA: Insufficient documentation

## 2014-06-05 DIAGNOSIS — Z8781 Personal history of (healed) traumatic fracture: Secondary | ICD-10-CM | POA: Insufficient documentation

## 2014-06-05 DIAGNOSIS — Z8742 Personal history of other diseases of the female genital tract: Secondary | ICD-10-CM | POA: Insufficient documentation

## 2014-06-05 DIAGNOSIS — H109 Unspecified conjunctivitis: Secondary | ICD-10-CM | POA: Insufficient documentation

## 2014-06-05 DIAGNOSIS — Z792 Long term (current) use of antibiotics: Secondary | ICD-10-CM | POA: Insufficient documentation

## 2014-06-05 MED ORDER — ERYTHROMYCIN 5 MG/GM OP OINT: 1.0000 "application " | TOPICAL_OINTMENT | Freq: Four times a day (QID) | OPHTHALMIC | Status: DC
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
## 2014-06-05 NOTE — Discharge Instructions (Signed)
Call your eye specialist for further evaluation of your eye discomfort. Keep your appointment for tomorrow. Use a warm compress over 3-4 times a day. Call for a follow up appointment with a Family or Primary Care Provider.  Return if Symptoms worsen.   Take medication as prescribed.

## 2014-06-05 NOTE — ED Notes (Signed)
Per pt c/o redness and pain in her left eye that began on Thursday but has become worse since Friday.  Pt has tears coming from eye.  Pt denies any injury to eye or any itching.

## 2014-06-08 NOTE — ED Provider Notes (Signed)
Medical screening examination/treatment/procedure(s) were performed by non-physician practitioner and as supervising physician I was immediately available for consultation/collaboration.   EKG Interpretation None       Doug SouSam Caily Rakers, MD 06/08/14 [phone]

## 2014-09-26 ENCOUNTER — Encounter (HOSPITAL_COMMUNITY): Payer: Self-pay | Admitting: Emergency Medicine

## 2014-11-15 ENCOUNTER — Ambulatory Visit: Payer: Self-pay | Admitting: Family Medicine

## 2014-11-29 ENCOUNTER — Ambulatory Visit: Payer: Self-pay | Admitting: Family Medicine

## 2016-04-05 ENCOUNTER — Ambulatory Visit (INDEPENDENT_AMBULATORY_CARE_PROVIDER_SITE_OTHER): Payer: 59 | Admitting: Podiatry

## 2016-04-05 ENCOUNTER — Encounter: Payer: Self-pay | Admitting: Podiatry

## 2016-04-05 ENCOUNTER — Ambulatory Visit (INDEPENDENT_AMBULATORY_CARE_PROVIDER_SITE_OTHER): Payer: 59

## 2016-04-05 VITALS — BP 114/78 | HR 90 | Resp 16 | Ht 62.0 in | Wt 157.0 lb

## 2016-04-05 DIAGNOSIS — M79672 Pain in left foot: Secondary | ICD-10-CM

## 2016-04-05 DIAGNOSIS — L723 Sebaceous cyst: Secondary | ICD-10-CM

## 2016-04-05 DIAGNOSIS — M779 Enthesopathy, unspecified: Secondary | ICD-10-CM

## 2016-04-05 MED ORDER — TRIAMCINOLONE ACETONIDE 10 MG/ML IJ SUSP
10.0000 mg | Freq: Once | INTRAMUSCULAR | Status: AC
  Administered 2016-04-05: 10 mg

## 2016-04-05 MED ORDER — DICLOFENAC SODIUM 75 MG PO TBEC: 75.0000 mg | DELAYED_RELEASE_TABLET | Freq: Two times a day (BID) | ORAL | Status: DC

## 2016-04-05 NOTE — Progress Notes (Signed)
   Subjective:    Patient ID: Erica Garrett, female    DOB: 11/01/1986, 30 y.o.   MRN: [id number]  HPI Chief Complaint  Patient presents with  . Foot Pain    Left foot; dorsal; pt stated, "Saw Dr. Elijah Birkom 2 weeks ago and was told had a ganglion cyst; did get a cortisone shot in left foot 4 weeks ago"      Review of Systems  All other systems reviewed and are negative.      Objective:   Physical Exam        Assessment & Plan:

## 2016-04-05 NOTE — Progress Notes (Signed)
Subjective:     Patient ID: Erica Garrett, female   DOB: 07/05/1986, 30 y.o.   MRN: [id number]  HPI patient presents with pain on top of the left foot and also states on the medial side there's times where she gets pain. States that she's had this for around 2 months and she had had one previous cortisone injection which gave temporary relief   Review of Systems  All other systems reviewed and are negative.      Objective:   Physical Exam  Constitutional: She is oriented to person, place, and time.  Cardiovascular: Intact distal pulses.   Musculoskeletal: Normal range of motion.  Neurological: She is oriented to person, place, and time.  Skin: Skin is warm.  Nursing note and vitals reviewed.  neurovascular status intact muscle strength adequate range of motion within normal limits with patient found to have tendinitis and inflammation on the dorsum of the left foot with no indications of a cystic-like lesion. There is also some discomfort along the anterior tibial tendon as it comes over the ankle but it's nondescript with no indications of muscle strength loss. Patient's found to have good digital perfusion and is well oriented 3     Assessment:     Inflammatory tendinitis dorsal left foot with possibility for compensatory tendinitis    Plan:     H&P and x-rays reviewed. Today I did a proximal block I tried to aspirate the area was unable to get out any fluid and I injected the tenderness complex with 3 mg Kenalog 5 mill grams Xylocaine and placed on diclofenac 75 mg twice a day advised on heat and ice therapy and reappoint again in 4 weeks or earlier if needed  X-ray report was negative for signs of fracture or indications of arthritis or spurring

## 2016-05-03 ENCOUNTER — Encounter: Payer: Self-pay | Admitting: Podiatry

## 2016-05-03 ENCOUNTER — Ambulatory Visit (INDEPENDENT_AMBULATORY_CARE_PROVIDER_SITE_OTHER): Payer: 59 | Admitting: Podiatry

## 2016-05-03 DIAGNOSIS — M779 Enthesopathy, unspecified: Secondary | ICD-10-CM | POA: Diagnosis not present

## 2016-05-03 NOTE — Progress Notes (Signed)
Subjective:     Patient ID: Erica Garrett, female   DOB: [birth year], 30 y.o.   MRN: [id number]  HPI patient presents stating I'm doing better with the top of the foot with pain if I wear tighter shoe   Review of Systems     Objective:   Physical Exam Neurovascular status intact muscle strength adequate with inflammation dorsum left foot that is mild in its intensity with no current fluid buildup noted    Assessment:     Improve tendinitis left with still slight irritation    Plan:     Advised on wider shoe gear and utilization of anti-inflammatories and also heat and ice therapy. Reappoint as needed

## 2016-12-04 ENCOUNTER — Other Ambulatory Visit: Payer: Self-pay | Admitting: Obstetrics and Gynecology

## 2016-12-04 DIAGNOSIS — N6002 Solitary cyst of left breast: Secondary | ICD-10-CM

## 2016-12-10 ENCOUNTER — Ambulatory Visit
Admission: RE | Admit: 2016-12-10 | Discharge: 2016-12-10 | Disposition: A | Discharge status: Home / Self Care | Payer: 59 | Source: Ambulatory Visit | Attending: Obstetrics and Gynecology | Admitting: Obstetrics and Gynecology

## 2016-12-10 DIAGNOSIS — N6002 Solitary cyst of left breast: Secondary | ICD-10-CM

## 2016-12-13 ENCOUNTER — Encounter: Payer: Self-pay | Admitting: Podiatry

## 2016-12-13 ENCOUNTER — Ambulatory Visit (INDEPENDENT_AMBULATORY_CARE_PROVIDER_SITE_OTHER): Payer: 59

## 2016-12-13 ENCOUNTER — Ambulatory Visit (INDEPENDENT_AMBULATORY_CARE_PROVIDER_SITE_OTHER): Payer: 59 | Admitting: Podiatry

## 2016-12-13 DIAGNOSIS — M7750 Other enthesopathy of unspecified foot: Secondary | ICD-10-CM

## 2016-12-13 DIAGNOSIS — M779 Enthesopathy, unspecified: Secondary | ICD-10-CM | POA: Diagnosis not present

## 2016-12-13 DIAGNOSIS — M25572 Pain in left ankle and joints of left foot: Secondary | ICD-10-CM

## 2016-12-13 MED ORDER — TRIAMCINOLONE ACETONIDE 10 MG/ML IJ SUSP
10.0000 mg | Freq: Once | INTRAMUSCULAR | Status: AC
  Administered 2016-12-13: 10 mg

## 2016-12-15 NOTE — Progress Notes (Signed)
Subjective:     Patient ID: Erica Garrett, female   DOB: 18-Jun-1986, 31 y.o.   MRN: [id number]  HPI patient presents stating I'm getting pain again on top of my foot with approximate 6 months of relief. 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

## 2017-04-03 ENCOUNTER — Ambulatory Visit (INDEPENDENT_AMBULATORY_CARE_PROVIDER_SITE_OTHER): Payer: 59 | Admitting: Podiatry

## 2017-04-03 ENCOUNTER — Ambulatory Visit (INDEPENDENT_AMBULATORY_CARE_PROVIDER_SITE_OTHER): Payer: 59

## 2017-04-03 ENCOUNTER — Encounter: Payer: Self-pay | Admitting: Podiatry

## 2017-04-03 DIAGNOSIS — S92501A Displaced unspecified fracture of right lesser toe(s), initial encounter for closed fracture: Secondary | ICD-10-CM

## 2017-04-03 DIAGNOSIS — S99922A Unspecified injury of left foot, initial encounter: Secondary | ICD-10-CM

## 2017-04-04 NOTE — Progress Notes (Signed)
Subjective:    Patient ID: Erica Garrett, female   DOB: 31 y.o.   MRN: [id number]   HPI patient presents stating that she dropped a large box and banged her toes on her left third fourth and fifth toes and it's very sore    ROS      Objective:  Physical Exam Neurovascular status intact negative Homans sign noted with patient found to have edema mostly centered in the fifth digit left with slight changes in the third and fourth digits    Assessment:     Traumatized left fifth digit with possibility for fracture    Plan:    H&P x-ray reviewed with patient. I did discuss fracture the fifth digit and that it should heal uneventfully but we'll probably take 8-12 weeks for complete healing. Patient will be seen back for us to recheck  X-ray indicates small fleck fracture of the base of the phalanx medial side that should heal uneventfully

## 2020-09-27 ENCOUNTER — Other Ambulatory Visit: Payer: Self-pay

## 2020-09-27 DIAGNOSIS — Z20822 Contact with and (suspected) exposure to covid-19: Secondary | ICD-10-CM

## 2020-09-28 LAB — NOVEL CORONAVIRUS, NAA: SARS-CoV-2, NAA: NOT DETECTED

## 2020-09-28 LAB — SARS-COV-2, NAA 2 DAY TAT

## 2022-03-01 ENCOUNTER — Other Ambulatory Visit: Payer: Self-pay | Admitting: Obstetrics and Gynecology

## 2022-03-01 DIAGNOSIS — N6320 Unspecified lump in the left breast, unspecified quadrant: Secondary | ICD-10-CM

## 2022-03-11 ENCOUNTER — Other Ambulatory Visit: Payer: Self-pay | Admitting: Obstetrics and Gynecology

## 2022-03-11 DIAGNOSIS — N6310 Unspecified lump in the right breast, unspecified quadrant: Secondary | ICD-10-CM

## 2022-03-20 ENCOUNTER — Ambulatory Visit
Admission: RE | Admit: 2022-03-20 | Discharge: 2022-03-20 | Disposition: A | Discharge status: Home / Self Care | Payer: 59 | Source: Ambulatory Visit | Attending: Obstetrics and Gynecology | Admitting: Obstetrics and Gynecology

## 2022-03-20 ENCOUNTER — Ambulatory Visit
Admission: RE | Admit: 2022-03-20 | Discharge: 2022-03-20 | Disposition: A | Discharge status: Home / Self Care | Payer: BC Managed Care – PPO | Source: Ambulatory Visit | Attending: Obstetrics and Gynecology | Admitting: Obstetrics and Gynecology

## 2022-03-20 DIAGNOSIS — N6310 Unspecified lump in the right breast, unspecified quadrant: Secondary | ICD-10-CM

## 2024-03-10 ENCOUNTER — Other Ambulatory Visit: Payer: Self-pay | Admitting: Obstetrics and Gynecology

## 2024-03-10 IMAGING — MG DIGITAL DIAGNOSTIC BILAT W/ TOMO W/ CAD
8 of 15 series · 8 of 40 positions shown · non-contrast
Comparison: Previous exam(s).

CLINICAL DATA: Palpable lump in the right breast felt by the
patient's physician.

EXAM:
DIGITAL DIAGNOSTIC BILATERAL MAMMOGRAM WITH TOMOSYNTHESIS AND CAD;
ULTRASOUND RIGHT BREAST LIMITED
TECHNIQUE: Bilateral digital diagnostic mammography and breast tomosynthesis
was performed. The images were evaluated with computer-aided
detection.; Targeted ultrasound examination of the right breast was
performed

[L CC synth-2D (1 of 3)]
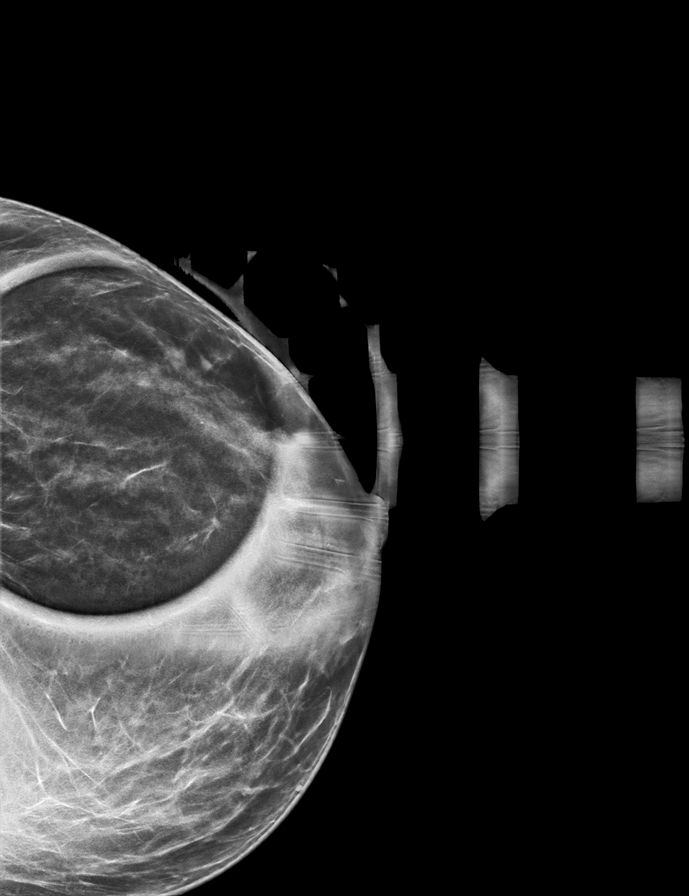

[R MLO synth-2D (1 of 2)]
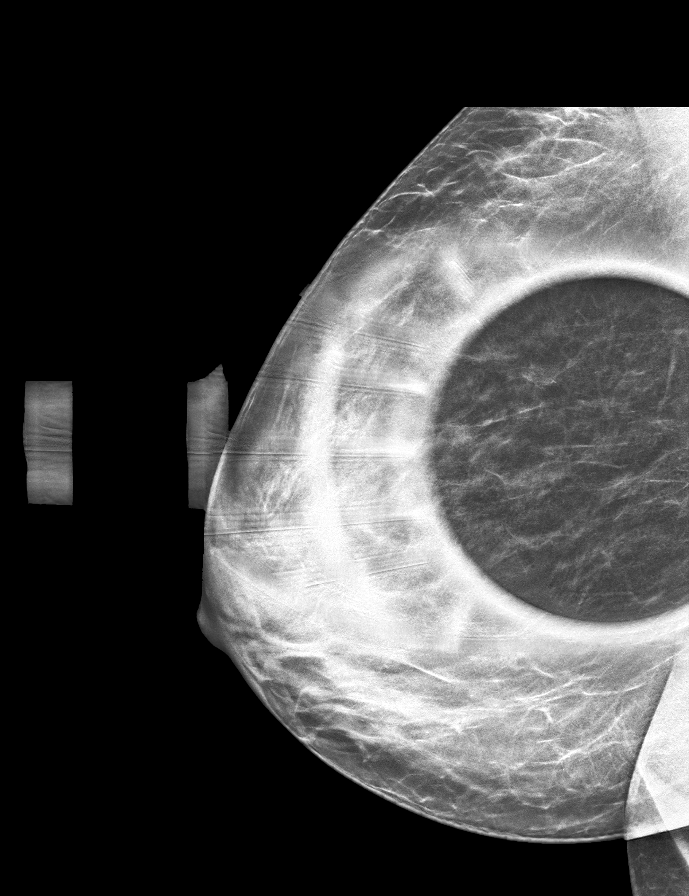

[L MLO synth-2D]
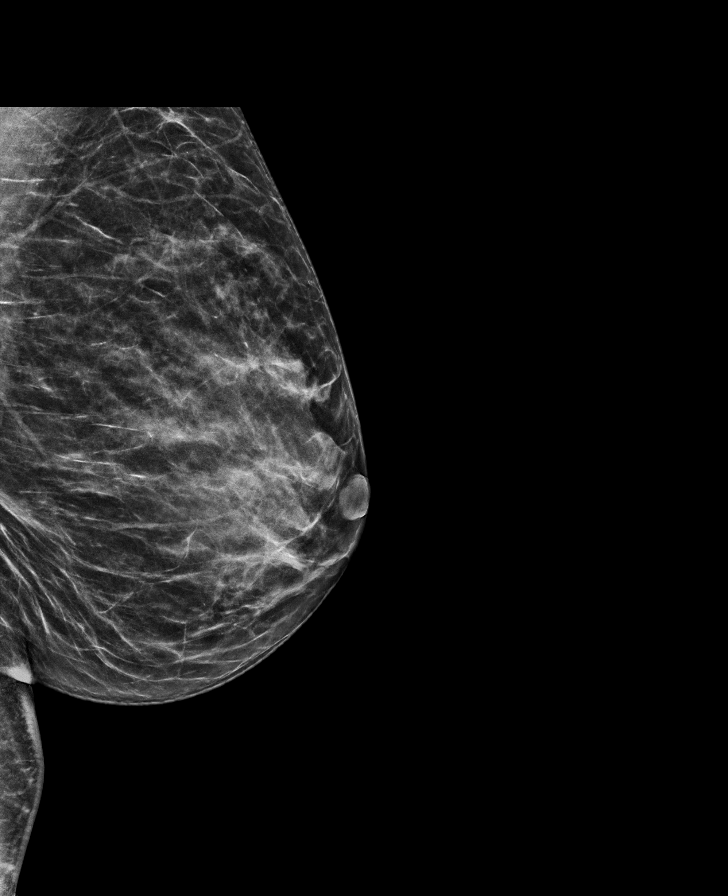

[L CC synth-2D (2 of 3)]
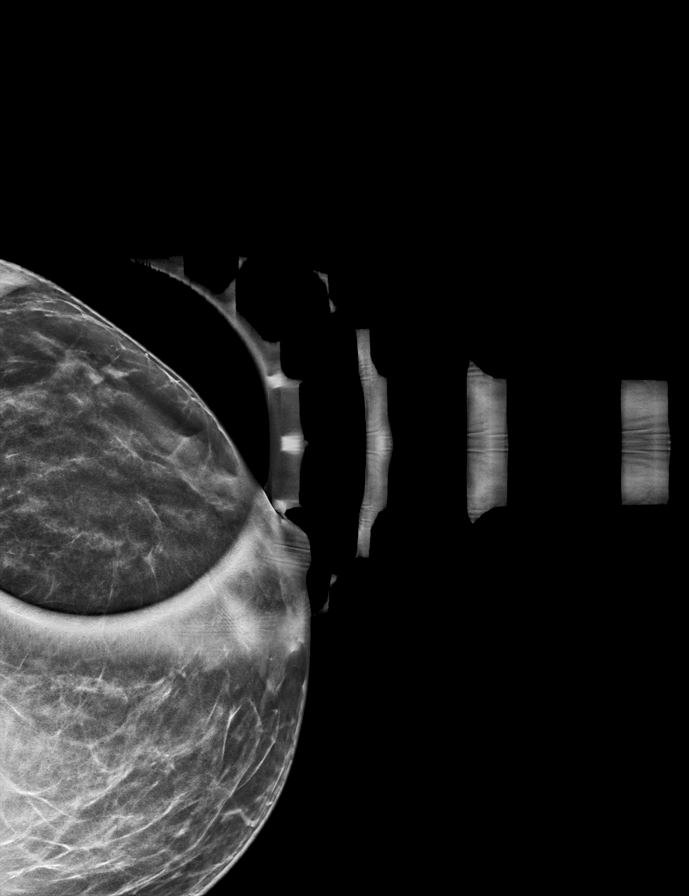

[L CC synth-2D (3 of 3)]
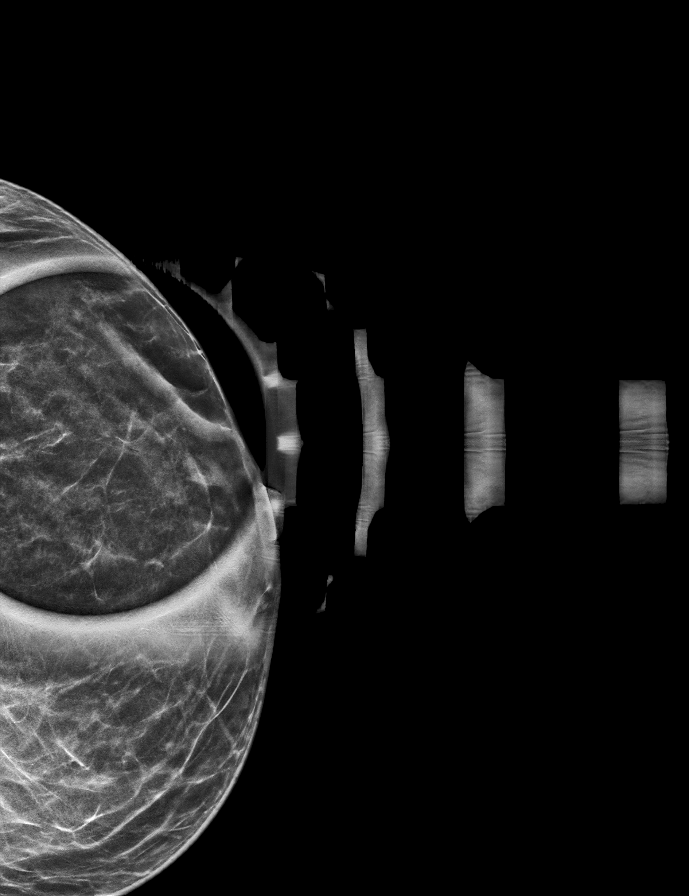

[R CC synth-2D]
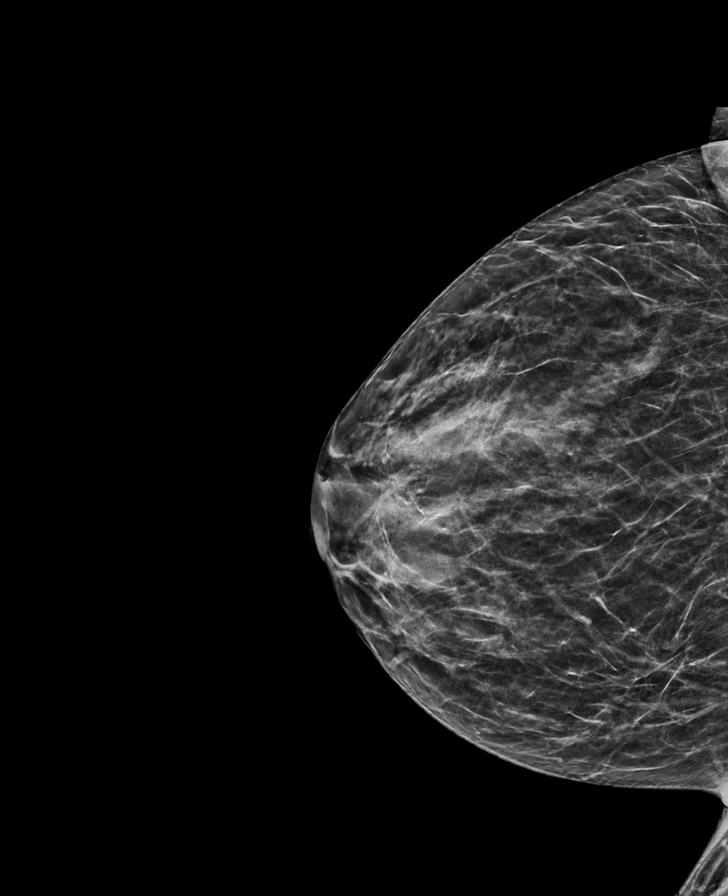

[R MLO synth-2D (2 of 2)]
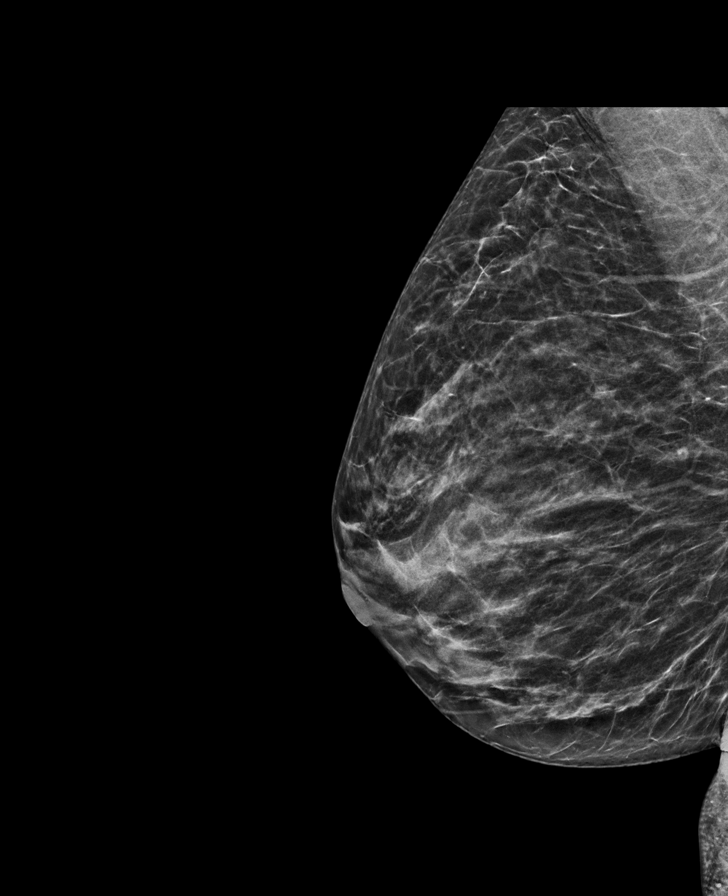

[L CC tomo · tomo slice 40/59.0]
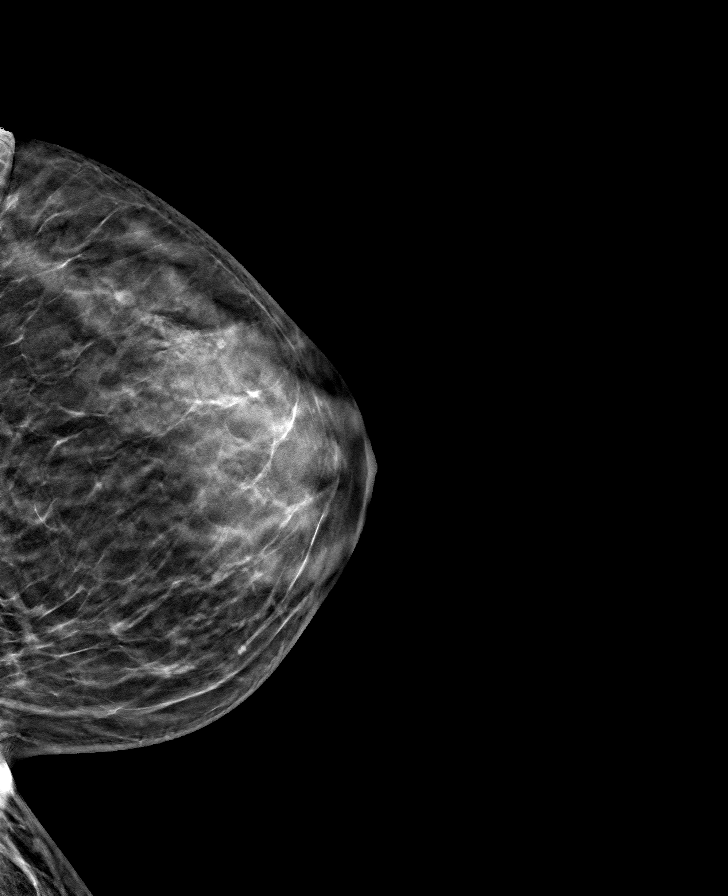

[8 of 40 positions shown; findings below may reference images not displayed]

ACR Breast Density Category c: The breast tissue is heterogeneously
dense, which may obscure small masses.
FINDINGS: An asymmetry in the lateral left breast at a mid depth is unchanged
on today's imaging compared to November 2016, of no concern. An
asymmetry in the right breast near the level the nipple at a
posterior depth on the MLO view resolves on additional imaging. No
new or suspicious findings in either breast.

Targeted ultrasound is performed, showing no sonographic correlate
for the lump felt by the patient's physician.
IMPRESSION: No mammographic or sonographic evidence of malignancy. No cause for
the lump felt by the patient's position.

RECOMMENDATION:
Treatment of the palpable lump should be based on clinical and
physical exam given lack of imaging findings. Recommend annual
screening mammography.

I have discussed the findings and recommendations with the patient.
If applicable, a reminder letter will be sent to the patient
regarding the next appointment.

BI-RADS CATEGORY  2: Benign.

## 2024-03-11 NOTE — H&P (Signed)
 Erica Garrett  is a 38 y.o. female, P: 0-0-4-0 who presents for hysteroscopic removal of a submucosal fibroid because of heavy menstrual bleeding. Starting in January of  this year, the patient's 5 day menstrual flow went from 5 to 8 days with the need to change a maxi pad every 2 hours. She denies any clots or cramping.  A pelvic ultrasound in April 2025 showed an anteverted uterus-volume = 87.3 cc, measuring  8.26 x 5.07 x 3.98 cm, endometrium: 18.47 mm with a 1.65 cm submucosal fibroid; right ovary-3.02 cm and left ovary-3.61 cm.  Her TSH was 0.682 and hemoglobin/hematocrit 11.4/34.4 respectively.  The patient was given both medical and surgical management options for her symptoms and ultrasound findings, however, she has chosen to proceed with removal of the submucosal fibroid by hysteroscopy.  Past Medical History  OB History: G: 4: P: 0-0-4-0  GYN History: menarche: 38 YO;     LMP: 02/20/2024;    Contraception no method  The patient reports a past history of: chlamydia.  Remote history of abnormal PAP smear that was repeated and returned normal.  Last PAP smear: 2021-normal with negative HPV  Medical History: Attention Deficit Disorder  Surgical History: 2003: Dilatation and Curettage   Family History: Asthma, Hypertension, Sleep Apnea, Irritable Bowel Syndrome,  Arthritis, Hypothyroidism, Lung Cancer, Stroke and Aneurysm.  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; Denies tobacco use and occasionally uses alcohol  Medications: Adderall 15 mg daily  Allergies  Allergen Reactions   Coconut Flavoring Agent (Non-Screening) Itching and Swelling    sWELLING OF TONGUE.PT IS ALLERGIC TO COCONUT   Pineapple Itching and Swelling    SWELLING OF TONGUE    ROS:  Denies headache, vision changes, nasal congestion, dysphagia, tinnitus, dizziness, hoarseness, cough,  chest pain, shortness of breath, nausea, vomiting, diarrhea,constipation,  urinary frequency, urgency   dysuria, hematuria, vaginitis symptoms, pelvic pain, swelling of joints,easy bruising,  myalgias, arthralgias, skin rashes, unexplained weight loss and except as is mentioned in the history of present illness, patient's review of systems is otherwise negative.    Physical Exam  Bp: 122/80;  Weight: 163.8 lbs.;  Height: 5'2";  BMI: 30  Neck: supple without masses or thyromegaly Lungs: clear to auscultation Heart: regular rate and rhythm Abdomen: soft, non-tender and no organomegaly Pelvic:EGBUS- wnl; vagina-normal rugae; uterus-normal size, cervix without lesions or motion tenderness; adnexae-no tenderness or masses Extremities:  no clubbing, cyanosis or edema   Assesment: Menorrhagia                      Submucosal Fibroid   Disposition:  A discussion was held with patient regarding the indication for her procedure(s) along with the risks, which include but are not limited to: reaction to anesthesia, damage to adjacent organs, uterine scarring,  infection and excessive bleeding. The patient verbalized understanding of these risks and has consented to proceed with a Hysteroscopic Myomectomy with Myosure at [facility] on Mar 25, 2024.   CSN# [id number]   Aneisa Karren J. [place], PA-C  for Dr. Ulysess Gang. Rivard

## 2024-03-17 ENCOUNTER — Encounter (HOSPITAL_COMMUNITY): Payer: Self-pay | Admitting: Obstetrics and Gynecology

## 2024-03-17 ENCOUNTER — Encounter (HOSPITAL_COMMUNITY): Payer: Self-pay

## 2024-03-17 NOTE — Progress Notes (Signed)
 Spoke w/ via phone for pre-op interview--- Erica Garrett Lab needs dos---- UPT, CBC and T&S. Per Careers adviser.        Lab results------ COVID test -----patient states asymptomatic no test needed Arrive at -------1215 NPO after MN NO Solid Food.  Clear liquids from MN until---1115 Pre-Surgery Ensure or G2:  Med rec completed Medications to take morning of surgery -----NONE Diabetic medication -----  GLP1 agonist last dose: GLP1 instructions:  Patient instructed no nail polish to be worn day of surgery Patient instructed to bring photo id and insurance card day of surgery Patient aware to have Driver (ride ) / caregiver    for 24 hours after surgery - [name] Patient Special Instructions ----- Shower with antibacterial soap. Pre-Op special Instructions -----  Patient verbalized understanding of instructions that were given at this phone interview. Patient denies chest pain, sob, fever, cough at the interview.

## 2024-03-25 ENCOUNTER — Other Ambulatory Visit: Payer: Self-pay

## 2024-03-25 ENCOUNTER — Encounter (HOSPITAL_COMMUNITY)
Admission: RE | Disposition: A | Discharge status: Home / Self Care | Payer: Self-pay | Source: Home / Self Care | Attending: Obstetrics and Gynecology

## 2024-03-25 ENCOUNTER — Ambulatory Visit (HOSPITAL_COMMUNITY)
Admission: RE | Admit: 2024-03-25 | Discharge: 2024-03-25 | Disposition: A | Discharge status: Home / Self Care | Attending: Obstetrics and Gynecology | Admitting: Obstetrics and Gynecology

## 2024-03-25 ENCOUNTER — Ambulatory Visit: Admit: 2024-03-25 | Admitting: Obstetrics and Gynecology

## 2024-03-25 ENCOUNTER — Ambulatory Visit (HOSPITAL_COMMUNITY): Admitting: Anesthesiology

## 2024-03-25 ENCOUNTER — Encounter (HOSPITAL_COMMUNITY): Payer: Self-pay | Admitting: Obstetrics and Gynecology

## 2024-03-25 DIAGNOSIS — N920 Excessive and frequent menstruation with regular cycle: Secondary | ICD-10-CM | POA: Diagnosis present

## 2024-03-25 DIAGNOSIS — D250 Submucous leiomyoma of uterus: Secondary | ICD-10-CM | POA: Diagnosis not present

## 2024-03-25 HISTORY — PX: HYSTEROSCOPY WITH MYOMECTOMY: SHX7591

## 2024-03-25 LAB — CBC
HCT: 37 % (ref 36.0–46.0)
Hemoglobin: 12.2 g/dL (ref 12.0–15.0)
MCH: 29.7 pg (ref 26.0–34.0)
MCHC: 33 g/dL (ref 30.0–36.0)
MCV: 90 fL (ref 80.0–100.0)
Platelets: 239 10*3/uL (ref 150–400)
RBC: 4.11 MIL/uL (ref 3.87–5.11)
RDW: 12.3 % (ref 11.5–15.5)
WBC: 6.6 10*3/uL (ref 4.0–10.5)
nRBC: 0 % (ref 0.0–0.2)

## 2024-03-25 LAB — TYPE AND SCREEN
ABO/RH(D): B POS
Antibody Screen: NEGATIVE

## 2024-03-25 LAB — ABO/RH: ABO/RH(D): B POS

## 2024-03-25 LAB — POCT PREGNANCY, URINE: Preg Test, Ur: NEGATIVE

## 2024-03-25 SURGERY — HYSTEROSCOPY WITH MYOMECTOMY
Anesthesia: Monitor Anesthesia Care

## 2024-03-25 SURGERY — HYSTEROSCOPY WITH MYOMECTOMY
Anesthesia: General

## 2024-03-25 MED ORDER — ONDANSETRON HCL 4 MG/2ML IJ SOLN
INTRAMUSCULAR | Status: DC | PRN
  Administered 2024-03-25: 4 mg via INTRAVENOUS

## 2024-03-25 MED ORDER — FENTANYL CITRATE (PF) 250 MCG/5ML IJ SOLN
INTRAMUSCULAR | Status: AC
  Filled 2024-03-25: qty 5

## 2024-03-25 MED ORDER — CEFAZOLIN SODIUM-DEXTROSE 2-4 GM/100ML-% IV SOLN
2.0000 g | INTRAVENOUS | Status: AC
  Administered 2024-03-25: 2 g via INTRAVENOUS

## 2024-03-25 MED ORDER — OXYCODONE HCL 5 MG PO TABS: 5.0000 mg | ORAL_TABLET | Freq: Once | ORAL | Status: DC | PRN

## 2024-03-25 MED ORDER — DROPERIDOL 2.5 MG/ML IJ SOLN: 0.6250 mg | Freq: Once | INTRAMUSCULAR | Status: DC | PRN

## 2024-03-25 MED ORDER — ACETAMINOPHEN 500 MG PO TABS
ORAL_TABLET | ORAL | Status: AC
  Administered 2024-03-25: 1000 mg
  Filled 2024-03-25: qty 2

## 2024-03-25 MED ORDER — SODIUM CHLORIDE 0.9 % IR SOLN
Status: DC | PRN
  Administered 2024-03-25: 3000 mL
  Administered 2024-03-25: 1000 mL
  Administered 2024-03-25: 3000 mL

## 2024-03-25 MED ORDER — SCOPOLAMINE 1 MG/3DAYS TD PT72: 1.0000 | MEDICATED_PATCH | TRANSDERMAL | Status: DC

## 2024-03-25 MED ORDER — SODIUM CHLORIDE (PF) 0.9 % IJ SOLN
INTRAMUSCULAR | Status: AC
  Filled 2024-03-25: qty 100

## 2024-03-25 MED ORDER — ENSURE PRE-SURGERY PO LIQD: 296.0000 mL | Freq: Once | ORAL | Status: DC

## 2024-03-25 MED ORDER — DEXAMETHASONE SODIUM PHOSPHATE 10 MG/ML IJ SOLN
INTRAMUSCULAR | Status: DC | PRN
  Administered 2024-03-25: 5 mg via INTRAVENOUS

## 2024-03-25 MED ORDER — CEFAZOLIN SODIUM-DEXTROSE 2-4 GM/100ML-% IV SOLN
INTRAVENOUS | Status: AC
Start: 2024-03-25 — End: 2024-03-25
  Filled 2024-03-25: qty 100

## 2024-03-25 MED ORDER — CHLORHEXIDINE GLUCONATE 0.12 % MT SOLN
15.0000 mL | Freq: Once | OROMUCOSAL | Status: AC
  Administered 2024-03-25: 15 mL via OROMUCOSAL

## 2024-03-25 MED ORDER — CELECOXIB 200 MG PO CAPS
ORAL_CAPSULE | ORAL | Status: AC
Start: 2024-03-25 — End: 2024-03-25
  Administered 2024-03-25: 400 mg
  Filled 2024-03-25: qty 2

## 2024-03-25 MED ORDER — CHLOROPROCAINE HCL 1 % IJ SOLN
INTRAMUSCULAR | Status: DC | PRN
  Administered 2024-03-25: 10 mL

## 2024-03-25 MED ORDER — VASOPRESSIN 20 UNIT/ML IV SOLN
INTRAVENOUS | Status: DC | PRN
  Administered 2024-03-25: 7 mL via INTRAMUSCULAR

## 2024-03-25 MED ORDER — ACETAMINOPHEN 500 MG PO TABS: 1000.0000 mg | ORAL_TABLET | ORAL | Status: DC

## 2024-03-25 MED ORDER — CHLORHEXIDINE GLUCONATE 0.12 % MT SOLN
OROMUCOSAL | Status: AC
  Filled 2024-03-25: qty 15

## 2024-03-25 MED ORDER — OXYCODONE HCL 5 MG PO TABS: 5.0000 mg | ORAL_TABLET | ORAL | 0 refills | Status: AC | PRN

## 2024-03-25 MED ORDER — VASOPRESSIN 20 UNIT/ML IV SOLN
INTRAVENOUS | Status: AC
  Filled 2024-03-25: qty 1

## 2024-03-25 MED ORDER — ONDANSETRON HCL 4 MG/2ML IJ SOLN: 4.0000 mg | Freq: Once | INTRAMUSCULAR | Status: DC | PRN

## 2024-03-25 MED ORDER — LACTATED RINGERS IV SOLN: INTRAVENOUS | Status: DC

## 2024-03-25 MED ORDER — LIDOCAINE 2% (20 MG/ML) 5 ML SYRINGE
INTRAMUSCULAR | Status: DC | PRN
  Administered 2024-03-25: 60 mg via INTRAVENOUS

## 2024-03-25 MED ORDER — PROPOFOL 10 MG/ML IV BOLUS
INTRAVENOUS | Status: AC
  Filled 2024-03-25: qty 20

## 2024-03-25 MED ORDER — CELECOXIB 200 MG PO CAPS: 400.0000 mg | ORAL_CAPSULE | ORAL | Status: DC

## 2024-03-25 MED ORDER — ORAL CARE MOUTH RINSE: 15.0000 mL | Freq: Once | OROMUCOSAL | Status: AC

## 2024-03-25 MED ORDER — GABAPENTIN 300 MG PO CAPS: 300.0000 mg | ORAL_CAPSULE | ORAL | Status: DC

## 2024-03-25 MED ORDER — FENTANYL CITRATE (PF) 250 MCG/5ML IJ SOLN
INTRAMUSCULAR | Status: DC | PRN
  Administered 2024-03-25: 50 ug via INTRAVENOUS

## 2024-03-25 MED ORDER — POVIDONE-IODINE 10 % EX SWAB: 2.0000 | Freq: Once | CUTANEOUS | Status: DC

## 2024-03-25 MED ORDER — FENTANYL CITRATE (PF) 100 MCG/2ML IJ SOLN
25.0000 ug | INTRAMUSCULAR | Status: DC | PRN
  Administered 2024-03-25 (×2): 25 ug via INTRAVENOUS

## 2024-03-25 MED ORDER — PROPOFOL 10 MG/ML IV BOLUS
INTRAVENOUS | Status: DC | PRN
  Administered 2024-03-25: 200 mg via INTRAVENOUS

## 2024-03-25 MED ORDER — CHLOROPROCAINE HCL 1 % IJ SOLN
INTRAMUSCULAR | Status: AC
  Filled 2024-03-25: qty 30

## 2024-03-25 MED ORDER — SCOPOLAMINE 1 MG/3DAYS TD PT72
MEDICATED_PATCH | TRANSDERMAL | Status: AC
Start: 2024-03-25 — End: 2024-03-25
  Administered 2024-03-25: 1.5 mg
  Filled 2024-03-25: qty 1

## 2024-03-25 MED ORDER — OXYCODONE HCL 5 MG/5ML PO SOLN: 5.0000 mg | Freq: Once | ORAL | Status: DC | PRN

## 2024-03-25 MED ORDER — IBUPROFEN 600 MG PO TABS: 600.0000 mg | ORAL_TABLET | Freq: Four times a day (QID) | ORAL | 0 refills | Status: AC | PRN

## 2024-03-25 MED ORDER — MIDAZOLAM HCL 2 MG/2ML IJ SOLN
INTRAMUSCULAR | Status: DC | PRN
  Administered 2024-03-25: 2 mg via INTRAVENOUS

## 2024-03-25 MED ORDER — GABAPENTIN 300 MG PO CAPS
ORAL_CAPSULE | ORAL | Status: AC
Start: 2024-03-25 — End: 2024-03-25
  Administered 2024-03-25: 300 mg
  Filled 2024-03-25: qty 1

## 2024-03-25 MED ORDER — MIDAZOLAM HCL 2 MG/2ML IJ SOLN
INTRAMUSCULAR | Status: AC
  Filled 2024-03-25: qty 2

## 2024-03-25 MED ORDER — FENTANYL CITRATE (PF) 100 MCG/2ML IJ SOLN
INTRAMUSCULAR | Status: AC
  Filled 2024-03-25: qty 2

## 2024-03-25 SURGICAL SUPPLY — 17 items
COVER SURGICAL LIGHT HANDLE (MISCELLANEOUS) IMPLANT
GLOVE BIOGEL PI IND STRL 7.0 (GLOVE) IMPLANT
GLOVE ECLIPSE 6.5 STRL STRAW (GLOVE) ×2 IMPLANT
GLOVE SURG UNDER POLY LF SZ7 (GLOVE) ×4 IMPLANT
GOWN STRL REUS W/ TWL LRG LVL3 (GOWN DISPOSABLE) ×2 IMPLANT
IV NS 1000ML BAXH (IV SOLUTION) IMPLANT
KIT PROCEDURE FLUENT (KITS) ×2 IMPLANT
KIT TURNOVER KIT B (KITS) ×2 IMPLANT
NDL ASPIRATION 22 (NEEDLE) IMPLANT
NEEDLE ASPIRATION 22 (NEEDLE) ×1 IMPLANT
PACK VAGINAL MINOR WOMEN LF (CUSTOM PROCEDURE TRAY) ×2 IMPLANT
PAD OB MATERNITY 11 LF (PERSONAL CARE ITEMS) ×2 IMPLANT
SOL .9 NS 3000ML IRR UROMATIC (IV SOLUTION) IMPLANT
SOL PREP POV-IOD 4OZ 10% (MISCELLANEOUS) IMPLANT
SYSTEM TISS REMOVAL MYOSURE XL (MISCELLANEOUS) IMPLANT
TOWEL GREEN STERILE FF (TOWEL DISPOSABLE) ×2 IMPLANT
UNDERPAD 30X36 HEAVY ABSORB (UNDERPADS AND DIAPERS) ×2 IMPLANT

## 2024-03-25 NOTE — Transfer of Care (Signed)
 Immediate Anesthesia Transfer of Care Note  Patient: Erica Garrett  Procedure(s) Performed: HYSTEROSCOPY WITH MYOMECTOMY HYSTEROSCOPY WITH MYOMECTOMY  Patient Location: PACU  Anesthesia Type:General  Level of Consciousness: drowsy  Airway & Oxygen Therapy: Patient Spontanous Breathing and Patient connected to nasal cannula oxygen  Post-op Assessment: Report given to RN and Post -op Vital signs reviewed and stable  Post vital signs: Reviewed and stable  Last Vitals:  Vitals Value Taken Time  BP 137/99 03/25/24 1716  Temp    Pulse 80 03/25/24 1717  Resp 20 03/25/24 1717  SpO2 100 % 03/25/24 1717  Vitals shown include unfiled device data.  Last Pain:  Vitals:   03/25/24 1256  TempSrc: Oral         Complications: No notable events documented.

## 2024-03-25 NOTE — Interval H&P Note (Signed)
 History and Physical Interval Note:  03/25/2024 1:28 PM  Erica Garrett  has presented today for surgery, with the diagnosis of menorrhagia, submucousal uterine fibroids.  The various methods of treatment have been discussed with the patient and family. After consideration of risks, benefits and other options for treatment, the patient has consented to  Procedure(s): HYSTEROSCOPY WITH MYOMECTOMY (N/A) as a surgical intervention.  The patient's history has been reviewed, patient examined, no change in status, stable for surgery.  I have reviewed the patient's chart and labs.  Questions were answered to the patient's satisfaction.     Adell Hones A Janae Bonser

## 2024-03-25 NOTE — Anesthesia Preprocedure Evaluation (Signed)
 Anesthesia Evaluation  Patient identified by MRN, date of birth, ID band Patient awake    Reviewed: Allergy & Precautions, NPO status , Patient's Chart, lab work & pertinent test results  Airway Mallampati: II       Dental no notable dental hx. (+) Teeth Intact   Pulmonary neg pulmonary ROS   Pulmonary exam normal breath sounds clear to auscultation       Cardiovascular negative cardio ROS Normal cardiovascular exam Rhythm:Regular Rate:Normal     Neuro/Psych negative neurological ROS  negative psych ROS   GI/Hepatic negative GI ROS, Neg liver ROS,,,  Endo/Other  negative endocrine ROS    Renal/GU negative Renal ROS  negative genitourinary   Musculoskeletal negative musculoskeletal ROS (+)    Abdominal   Peds  Hematology negative hematology ROS (+)   Anesthesia Other Findings   Reproductive/Obstetrics Submucous fibroid Menorrhagia                              Anesthesia Physical Anesthesia Plan  ASA: 2  Anesthesia Plan: General   Post-op Pain Management: Minimal or no pain anticipated   Induction: Intravenous  PONV Risk Score and Plan: 4 or greater and Treatment may vary due to age or medical condition, Midazolam , Ondansetron  and Scopolamine  patch - Pre-op  Airway Management Planned: LMA  Additional Equipment: None  Intra-op Plan:   Post-operative Plan: Extubation in OR  Informed Consent: I have reviewed the patients History and Physical, chart, labs and discussed the procedure including the risks, benefits and alternatives for the proposed anesthesia with the patient or authorized representative who has indicated his/her understanding and acceptance.     Dental advisory given  Plan Discussed with: CRNA and Anesthesiologist  Anesthesia Plan Comments:          Anesthesia Quick Evaluation

## 2024-03-25 NOTE — Op Note (Signed)
 Preop diagnosis: submucosal fibroid  Postop diagnosis: same  Anesthesia: IV sedation  Anesthesiologist: Dr. Yvonnie Heritage  Procedure: Hysteroscopy, myomectomy with Myosure and endometrial curettage  Surgeon: Dr. Adell Hones Nusayba Cadenas  Procedure: After being informed of the planned procedure with possible complications including bleeding, infection and uterine perforation, informed consent was obtained and patient was taken to OR #6.  She was given IV sedation anesthesia without complication. She was placed in a dorsal decubitus position, prepped and draped in the sterile fashion and  her bladder was emptied with a red rubber catheter. Pelvic exam reveals anteverted normal uterus with 2 normal adnexa.  A speculum is inserted in the vagina. The cervix was grasped with a tenaculum forcep placed on the anterior lip.We proceed with a paracervical block using 1% Nesacaine , 10 cc. Uterus is sounded at 9 cm. The cervix is then easily dilated using Hegar dilator until # 25. This allows for easy placement of an operative hysteroscope. With perfusion of Normal Saline at a maximum pressure of 80 mmHg, we are able to evaluate the entire uterine cavity.  Observation: The cavity is filled with a 2 cm pedunculated fibroid attached to the anterior wall. Both tubal ostia were seen.   We proceed with resection of the fibroid using Myosure XL resectoscope. 90 % of the fibroid was resected when we reached our maximum fluid deficit We then removed our instrumentation. Using a sharp curette, we proceed with curettage of the endometrial cavity which returns a small amount of normal-appearing endometrium.  Instruments are then removed. Instrument and sponge count is complete x2. Estimated blood loss is minimal. Water deficit is 2330 cc of Normal Saline.  The procedure is very well tolerated by the patient who is taken to recovery room in a well and stable condition.  Specimen: uterine fibroid and endometrial curettings sent to  pathology.

## 2024-03-25 NOTE — Anesthesia Procedure Notes (Signed)
 Procedure Name: LMA Insertion Date/Time: 03/25/2024 4:09 PM  Performed by: Katrinka Parr, CRNAPre-anesthesia Checklist: Patient identified, Emergency Drugs available, Suction available and Patient being monitored Patient Re-evaluated:Patient Re-evaluated prior to induction Oxygen Delivery Method: Circle System Utilized Preoxygenation: Pre-oxygenation with 100% oxygen Induction Type: IV induction Ventilation: Mask ventilation without difficulty LMA: LMA inserted LMA Size: 4.0 Number of attempts: 1 Airway Equipment and Method: Bite block Placement Confirmation: positive ETCO2 Tube secured with: Tape Dental Injury: Teeth and Oropharynx as per pre-operative assessment

## 2024-03-25 NOTE — Discharge Instructions (Signed)
 POST-OPERATIVE INSTRUCTIONS TO PATIENT  Call [facility] at [phone]  for excessive pain, bleeding or temperature greater than or equal to 100.4 degrees (orally).    No driving for 24 hours No sexual activity for 2 weeks  Pain management:  Use Ibuprofen  600 mg every 6 hours as needed for pain. se Oxycodone  every 4-6 hours as needed if pain is not well controlled with Ibuprofen .. Use your pain medication as needed to maintain a pain level at or below 3/10 Use Colace 1-2 capsules per day as long as you are using pain medication to avoid constipation.       Diet: normal  Bathing: may shower day after surgery  Return to Dr. Duke Gibbons on 04/13/24 at 4:15 pm   Ona Bidding MD

## 2024-03-26 ENCOUNTER — Encounter (HOSPITAL_COMMUNITY): Payer: Self-pay | Admitting: Obstetrics and Gynecology

## 2024-03-26 NOTE — Anesthesia Postprocedure Evaluation (Signed)
 Anesthesia Post Note  Patient: Erica Garrett  Procedure(s) Performed: HYSTEROSCOPY WITH MYOMECTOMY HYSTEROSCOPY WITH MYOMECTOMY     Patient location during evaluation: PACU Anesthesia Type: General Level of consciousness: awake and alert, oriented and patient cooperative Pain management: pain level controlled Vital Signs Assessment: post-procedure vital signs reviewed and stable Respiratory status: spontaneous breathing, nonlabored ventilation and respiratory function stable Cardiovascular status: blood pressure returned to baseline and stable Postop Assessment: no apparent nausea or vomiting Anesthetic complications: no   No notable events documented.  Last Vitals:  Vitals:   03/25/24 1815 03/25/24 1830  BP: 122/88 118/82  Pulse: 67 67  Resp: 13 (!) 23  Temp:  (!) 36.3 C  SpO2: 97% 99%    Last Pain:  Vitals:   03/25/24 1830  TempSrc:   PainSc: 3    Pain Goal: Patients Stated Pain Goal: 3 (03/25/24 1830)                 Jacquelyne Matte

## 2024-03-30 LAB — SURGICAL PATHOLOGY
